# Patient Record
Sex: Female | Born: 1950 | Race: White | Hispanic: No | Marital: Married | Smoking: Never smoker
Health system: Southern US, Community
[De-identification: ages and names within clinical notes are randomized; demographics above are authoritative.]

## PROBLEM LIST (undated history)

## (undated) DIAGNOSIS — K219 Gastro-esophageal reflux disease without esophagitis: Secondary | ICD-10-CM

## (undated) DIAGNOSIS — E039 Hypothyroidism, unspecified: Secondary | ICD-10-CM

## (undated) DIAGNOSIS — E7800 Pure hypercholesterolemia, unspecified: Secondary | ICD-10-CM

## (undated) DIAGNOSIS — G4700 Insomnia, unspecified: Secondary | ICD-10-CM

## (undated) HISTORY — PX: CHOLECYSTECTOMY: SHX55

---

## 2008-06-28 ENCOUNTER — Ambulatory Visit: Payer: Self-pay | Admitting: Internal Medicine

## 2011-08-06 ENCOUNTER — Ambulatory Visit: Payer: Self-pay | Admitting: Internal Medicine

## 2012-12-23 ENCOUNTER — Ambulatory Visit: Payer: Self-pay

## 2014-04-18 ENCOUNTER — Ambulatory Visit: Payer: Self-pay | Admitting: Physician Assistant

## 2014-06-30 IMAGING — CR DG CHEST 2V
1 series · 2 of 2 positions shown · non-contrast
Comparison: none

REASON FOR EXAM: cough and fever
COMMENTS:

PROCEDURE:     MDR - MDR CHEST PA(OR AP) AND LATERAL  - December 23, 2012 [DATE]
RESULT:     Comparison: None.

[Series 1: pa · 0.17mm/px · 2 of 2 slices shown]
[im 1/2]
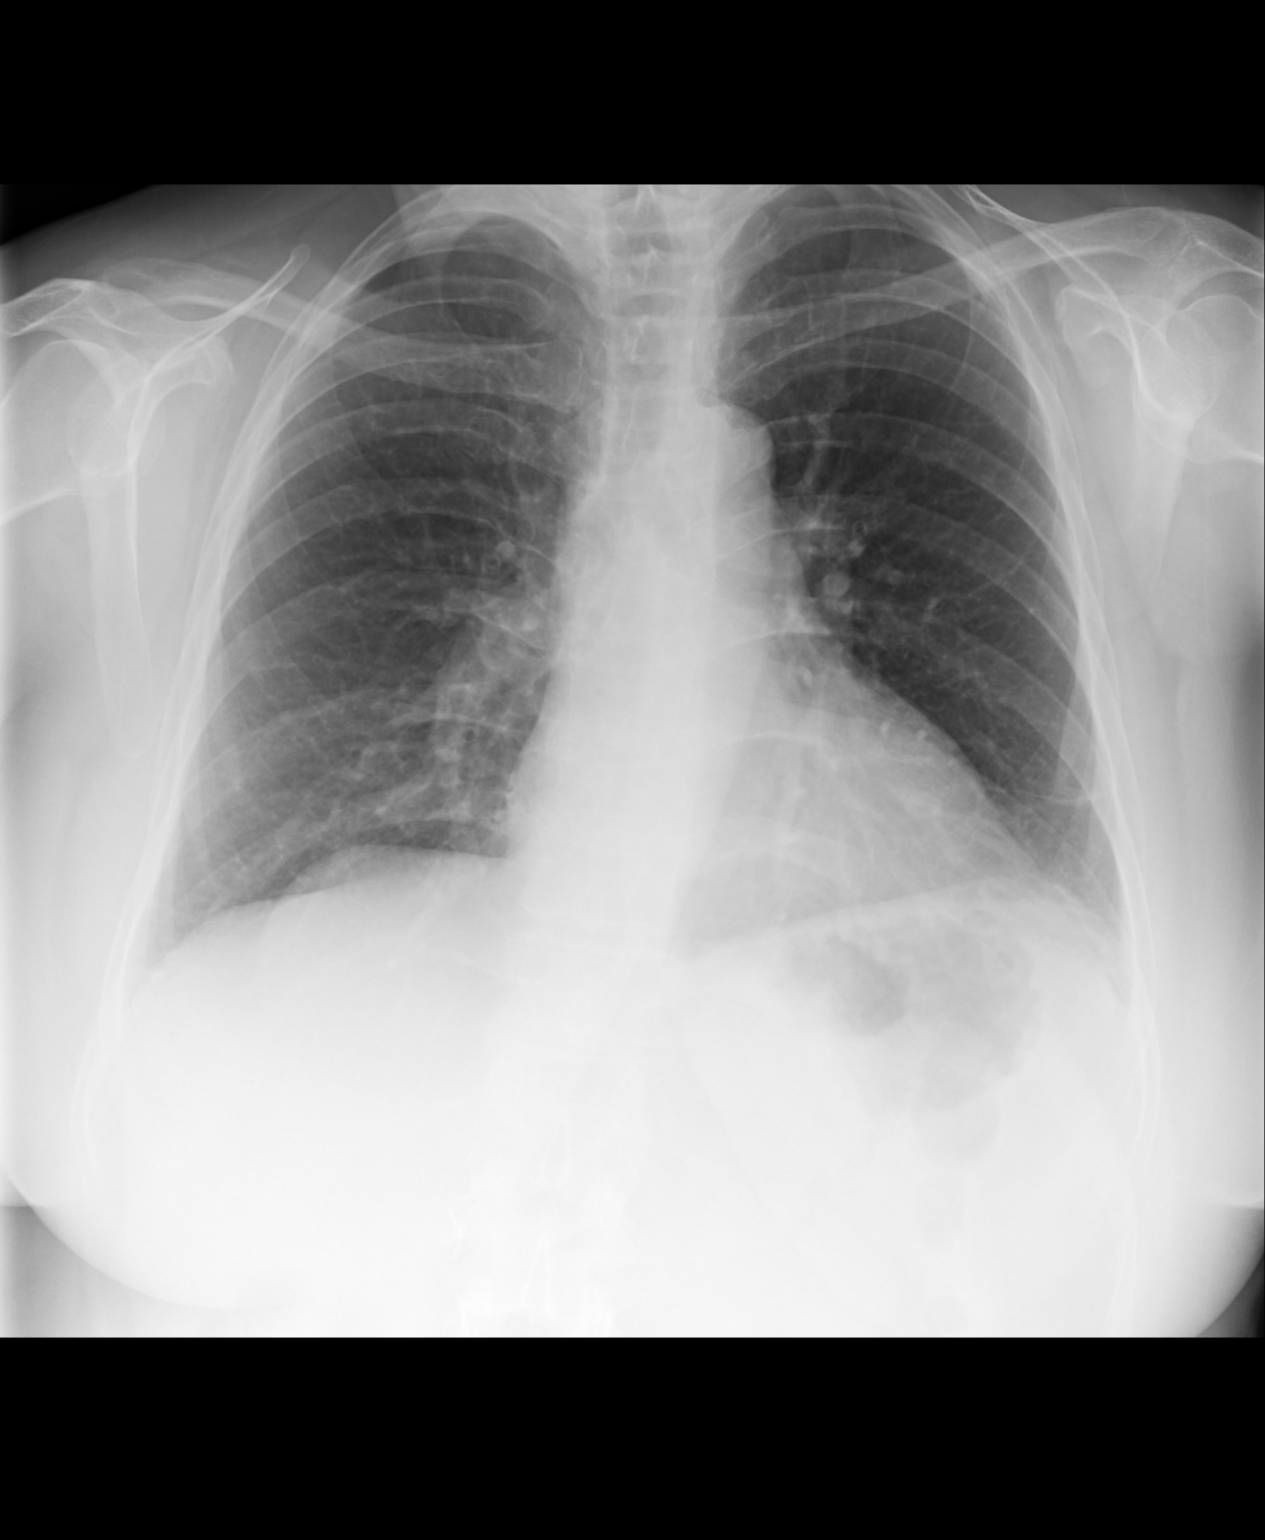
[im 2/2]
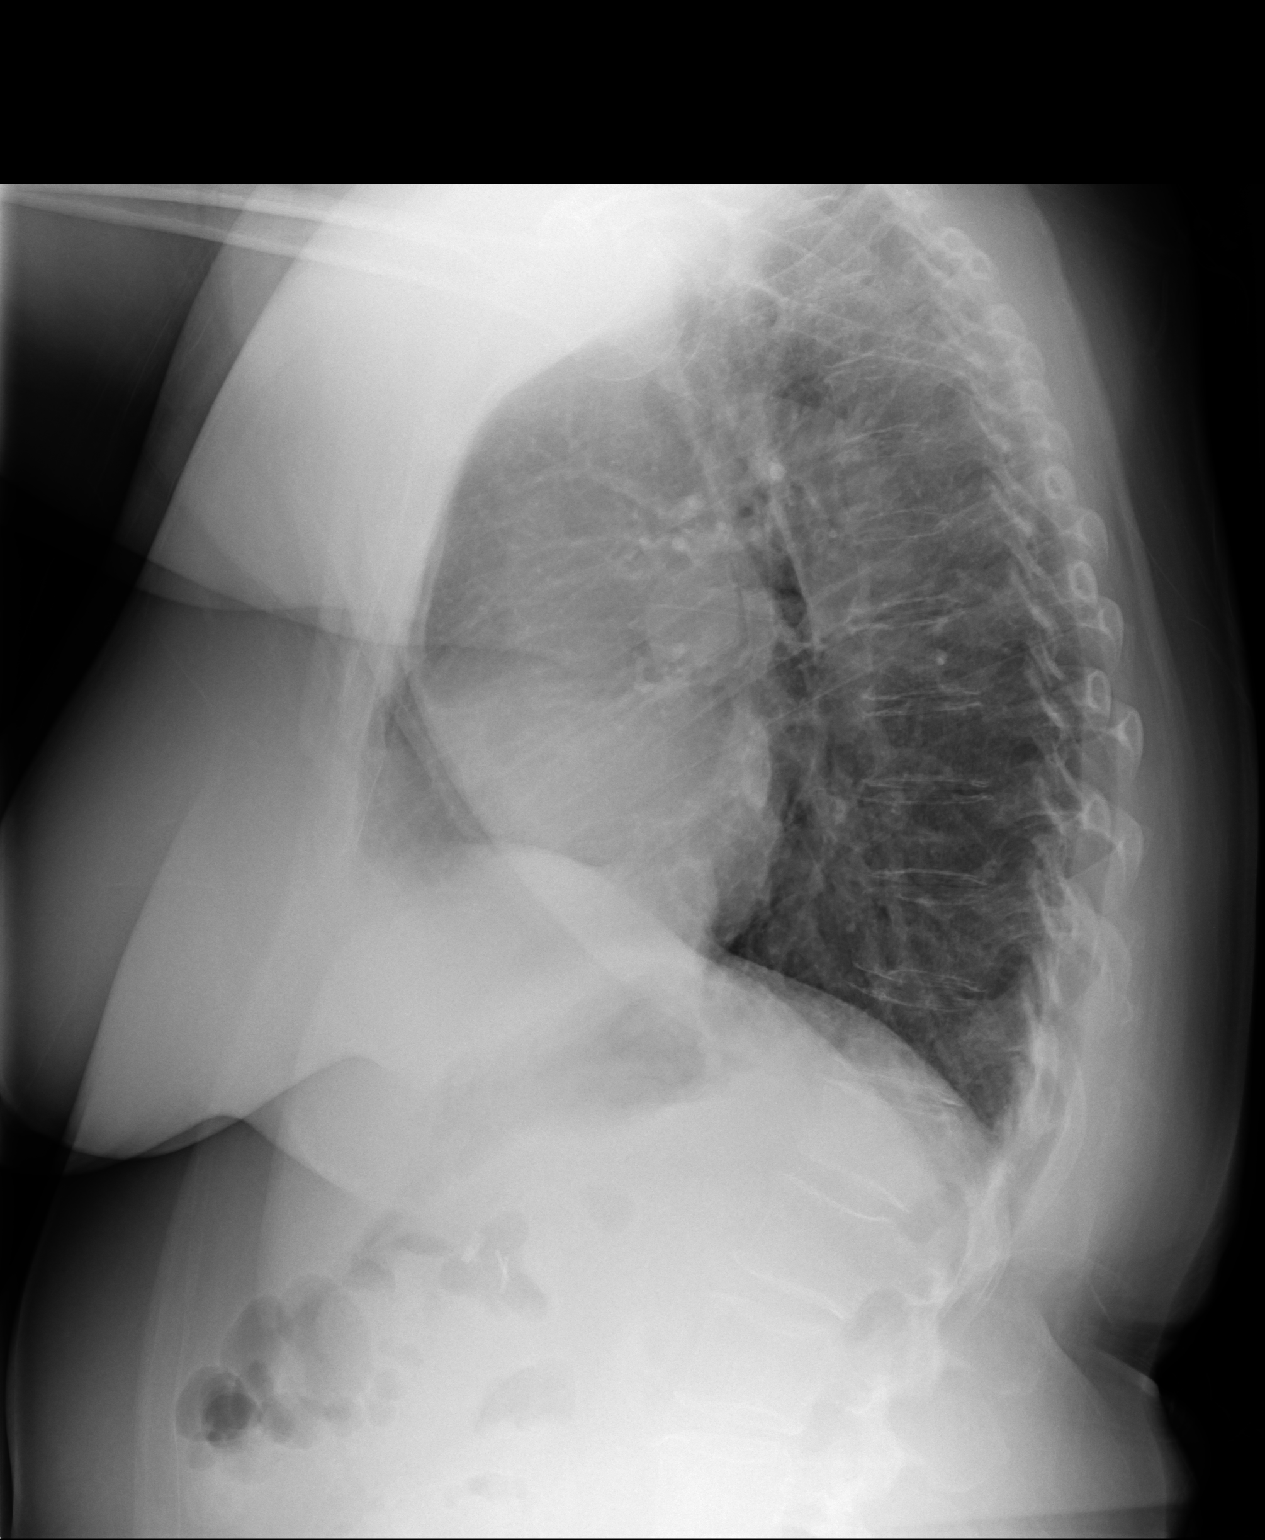

[2 of 2 positions shown; findings below may reference images not displayed]

FINDINGS: The heart and mediastinum are within normal limits. Minimal left basilar
reticular opacities are likely secondary to subsegmental atelectasis
scarring.
IMPRESSION: No acute cardiopulmonary disease.

[REDACTED]

## 2016-04-23 ENCOUNTER — Ambulatory Visit
Admission: EM | Admit: 2016-04-23 | Discharge: 2016-04-23 | Disposition: A | Discharge status: Home / Self Care | Payer: BC Managed Care – PPO

## 2016-04-23 DIAGNOSIS — J069 Acute upper respiratory infection, unspecified: Secondary | ICD-10-CM | POA: Diagnosis not present

## 2016-04-23 HISTORY — DX: Insomnia, unspecified: G47.00

## 2016-04-23 HISTORY — DX: Gastro-esophageal reflux disease without esophagitis: K21.9

## 2016-04-23 HISTORY — DX: Pure hypercholesterolemia, unspecified: E78.00

## 2016-04-23 HISTORY — DX: Hypothyroidism, unspecified: E03.9

## 2016-04-23 NOTE — Discharge Instructions (Signed)
Rest. Drink plenty of fluids.  ° °Follow up with your primary care physician this week as needed. Return to Urgent care for new or worsening concerns.  ° °

## 2016-04-23 NOTE — ED Provider Notes (Signed)
[facility] ____________________________________________  Time seen: Approximately 11:17 AM  I have reviewed the triage vital signs and the nursing notes.   HISTORY  Chief Complaint URI  HPI Donna Yu is a 65 y.o. female resents with a complaint of 2-3 days of runny nose, nasal drainage, postnasal drainage, scratchy throat, occasional achiness and occasional cough. Patient states that her symptoms are mild. Denies fevers. Patient reports that she was around her family this past weekend and reports that since then multiple other members of the family have been sick with similar. Patient states that the children ended up being worse off, and states that they were having fevers and worse symptoms.   Patient reports that she continues to eat and drink well and has remained active. Patient states that she came in today and requests possibly receiving an antiviral prescription for her symptoms.  Denies chest pain or shortness of breath, weakness, fevers, vision changes, activity changes, extremity pain, extremity swelling, dysuria, abdominal pain, fevers. Denies recent sickness. Denies recent hospitalization. Denies recent antibiotic use. Patient reports she has not taking any medications for same complaints.   PCP: Raul Del   Past Medical History:  Diagnosis Date  . GERD (gastroesophageal reflux disease)   . High cholesterol   . Hypothyroid   . Insomnia     There are no active problems to display for this patient.   Past Surgical History:  Procedure Laterality Date  . CHOLECYSTECTOMY      No current facility-administered medications for this encounter.   Current Outpatient Prescriptions:  .  clonazePAM (KLONOPIN) 0.5 MG tablet, Take 0.5 mg by mouth 2 (two) times daily as needed for anxiety., Disp: , Rfl:  .  lansoprazole (PREVACID) 15 MG capsule, Take 15 mg by mouth daily at 12 noon., Disp: , Rfl:  .  levothyroxine (SYNTHROID, LEVOTHROID) 88 MCG tablet, Take  88 mcg by mouth daily before breakfast., Disp: , Rfl:  .  Melatonin 10 MG TABS, Take by mouth., Disp: , Rfl:  .  ranitidine (ZANTAC) 75 MG tablet, Take 75 mg by mouth 2 (two) times daily., Disp: , Rfl:  .  rosuvastatin (CRESTOR) 5 MG tablet, Take 5 mg by mouth daily., Disp: , Rfl:   Allergies Penicillins  History reviewed. No pertinent family history.  Social History Social History  Substance Use Topics  . Smoking status: Never Smoker  . Smokeless tobacco: Never Used  . Alcohol use No    Review of Systems Constitutional: No fever/chills Eyes: No visual changes. ENT: No sore throat. Cardiovascular: Denies chest pain. Respiratory: Denies shortness of breath. Gastrointestinal: No abdominal pain.  No nausea, no vomiting.  No diarrhea.  No constipation. Genitourinary: Negative for dysuria. Musculoskeletal: Negative for back pain. Skin: Negative for rash. Neurological: Negative for headaches, focal weakness or numbness.  10-point ROS otherwise negative.  ____________________________________________   PHYSICAL EXAM:  VITAL SIGNS: ED Triage Vitals  Enc Vitals Group     BP 04/23/16 1055 (!) 114/55     Pulse Rate 04/23/16 1055 76     Resp 04/23/16 1055 18     Temp 04/23/16 1055 98.7 F (37.1 C)     Temp Source 04/23/16 1055 Oral     SpO2 04/23/16 1055 97 %     Weight --      Height --      Head Circumference --      Peak Flow --      Pain Score 04/23/16 1101 4     Pain  Loc --      Pain Edu? --      Excl. in GC? --    Constitutional: Alert and oriented. Well appearing and in no acute distress. Eyes: Conjunctivae are normal. PERRL. EOMI. Head: Atraumatic. No sinus tenderness to palpation. No swelling. No erythema.  Ears: no erythema, normal TMs bilaterally.   Nose:Nasal congestion with clear rhinorrhea  Mouth/Throat: Mucous membranes are moist. No pharyngeal erythema. No tonsillar swelling or exudate.  Neck: No stridor.  No cervical spine tenderness to  palpation. Hematological/Lymphatic/Immunilogical: No cervical lymphadenopathy. Cardiovascular: Normal rate, regular rhythm. Grossly normal heart sounds.  Good peripheral circulation. Respiratory: Normal respiratory effort.  No retractions. Lungs CTAB. No wheezes, rales or rhonchi. Good air movement.  Gastrointestinal: Soft and nontender. Normal Bowel sounds. No CVA tenderness. Musculoskeletal: No lower or upper extremity tenderness nor edema. No cervical, thoracic or lumbar tenderness to palpation. Neurologic:  Normal speech and language. No gross focal neurologic deficits are appreciated. No gait instability. Skin:  Skin is warm, dry and intact. No rash noted. Psychiatric: Mood and affect are normal. Speech and behavior are normal.  ___________________________________________   LABS (all labs ordered are listed, but only abnormal results are displayed)  Labs Reviewed - No data to display  PROCEDURES Procedures   INITIAL IMPRESSION / ASSESSMENT AND PLAN / ED COURSE  Pertinent labs & imaging results that were available during my care of the patient were reviewed by me and considered in my medical decision making (see chart for details).  Very well-appearing patient. No acute distress. Presents for complaints of 2-3 days of runny nose, nasal congestion and cough. Exam reassuring. Suspect viral upper respiratory infection. Counseled regarding use of antivirals. After discussing with patient, patient states that all that she does not want antiviral prescription. Patient declines need for prescription medication. Encouraged rest, fluids and PCP follow-up as needed.  Discussed follow up with Primary care physician this week. Discussed follow up and return parameters including no resolution or any worsening concerns. Patient verbalized understanding and agreed to plan.   ____________________________________________   FINAL CLINICAL IMPRESSION(S) / ED DIAGNOSES  Final diagnoses:  URI  (upper respiratory infection)     Discharge Medication List as of 04/23/2016 11:17 AM      Note: This dictation was prepared with Dragon dictation along with smaller phrase technology. Any transcriptional errors that result from this process are unintentional.    Clinical Course      Renford DillsLindsey Annistyn Depass, NP 04/23/16 1227

## 2016-04-23 NOTE — ED Triage Notes (Signed)
Pt reports multiple exposures to various URI viruses.  Has had drainage down back of throat with scratchy, achy, feels tired.  No fever, mild cough with drainage.

## 2016-10-30 ENCOUNTER — Ambulatory Visit
Admission: EM | Admit: 2016-10-30 | Discharge: 2016-10-30 | Disposition: A | Discharge status: Home / Self Care | Payer: Medicare Other | Attending: Family Medicine | Admitting: Family Medicine

## 2016-10-30 ENCOUNTER — Encounter: Payer: Self-pay | Admitting: Emergency Medicine

## 2016-10-30 DIAGNOSIS — R69 Illness, unspecified: Secondary | ICD-10-CM | POA: Diagnosis not present

## 2016-10-30 DIAGNOSIS — J111 Influenza due to unidentified influenza virus with other respiratory manifestations: Secondary | ICD-10-CM

## 2016-10-30 MED ORDER — HYDROCOD POLST-CPM POLST ER 10-8 MG/5ML PO SUER: 5.0000 mL | Freq: Every evening | ORAL | 0 refills | Status: AC | PRN

## 2016-10-30 MED ORDER — BENZONATATE 100 MG PO CAPS: 100.0000 mg | ORAL_CAPSULE | Freq: Three times a day (TID) | ORAL | 0 refills | Status: AC | PRN

## 2016-10-30 MED ORDER — OSELTAMIVIR PHOSPHATE 75 MG PO CAPS: 75.0000 mg | ORAL_CAPSULE | Freq: Two times a day (BID) | ORAL | 0 refills | Status: AC

## 2016-10-30 NOTE — ED Triage Notes (Signed)
Patient c/o cough, chest congestion fever that started yesterday. Patient reports fever last night.

## 2016-10-30 NOTE — ED Provider Notes (Signed)
[facility] ____________________________________________  Time seen: Approximately 1216 PM  I have reviewed the triage vital signs and the nursing notes.   HISTORY  Chief Complaint Fever and Cough   HPI Donna Yu is a 66 y.o. female presenting for the complaints of runny nose, nasal congestion, cough, chills and body aches that started yesterday. Reports accompanying fever. Reports symptoms were quick in onset. Reports felt fine yesterday morning. Reports has taken over-the-counter cough and congestion medications with some improvement, no resolution. States cough is a dry hacking cough, intermittently woke her up last night. Denies wheezing or shortness of breath. Denies known sick contact. Reports continues to eat and drink well. Denies sore throat.  Denies chest pain, shortness of breath, abdominal pain, dysuria, extremity pain, extremity swelling or rash. Denies recent sickness. Denies recent antibiotic use. Denies cardiac history. Denies renal insufficiency.   Kristopher Oppenheim, MD: PCP   Past Medical History:  Diagnosis Date  . GERD (gastroesophageal reflux disease)   . High cholesterol   . Hypothyroid   . Insomnia     There are no active problems to display for this patient.   Past Surgical History:  Procedure Laterality Date  . CHOLECYSTECTOMY       No current facility-administered medications for this encounter.   Current Outpatient Prescriptions:  .  levothyroxine (SYNTHROID, LEVOTHROID) 100 MCG tablet, Take 100 mcg by mouth daily before breakfast., Disp: , Rfl:  .  benzonatate (TESSALON PERLES) 100 MG capsule, Take 1 capsule (100 mg total) by mouth 3 (three) times daily as needed for cough., Disp: 15 capsule, Rfl: 0 .  chlorpheniramine-HYDROcodone (TUSSIONEX PENNKINETIC ER) 10-8 MG/5ML SUER, Take 5 mLs by mouth at bedtime as needed for cough. do not drive or operate machinery while taking as can cause drowsiness., Disp: 75 mL, Rfl:  0 .  clonazePAM (KLONOPIN) 0.5 MG tablet, Take 0.5 mg by mouth 2 (two) times daily as needed for anxiety., Disp: , Rfl:  .  lansoprazole (PREVACID) 15 MG capsule, Take 15 mg by mouth daily at 12 noon., Disp: , Rfl:  .  Melatonin 10 MG TABS, Take by mouth., Disp: , Rfl:  .  oseltamivir (TAMIFLU) 75 MG capsule, Take 1 capsule (75 mg total) by mouth every 12 (twelve) hours., Disp: 10 capsule, Rfl: 0 .  ranitidine (ZANTAC) 75 MG tablet, Take 75 mg by mouth 2 (two) times daily., Disp: , Rfl:  .  rosuvastatin (CRESTOR) 5 MG tablet, Take 5 mg by mouth daily., Disp: , Rfl:   Allergies Penicillins  History reviewed. No pertinent family history.  Social History Social History  Substance Use Topics  . Smoking status: Never Smoker  . Smokeless tobacco: Never Used  . Alcohol use No    Review of Systems Constitutional: As above. Eyes: No visual changes. ENT: As above. Cardiovascular: Denies chest pain. Respiratory: Denies shortness of breath. Gastrointestinal: No abdominal pain.  No nausea, no vomiting.  No diarrhea.  No constipation. Genitourinary: Negative for dysuria. Musculoskeletal: Negative for back pain. Skin: Negative for rash. Neurological: Negative for headaches, focal weakness or numbness.  10-point ROS otherwise negative.  ____________________________________________   PHYSICAL EXAM:  VITAL SIGNS: ED Triage Vitals  Enc Vitals Group     BP 10/30/16 1134 123/64     Pulse Rate 10/30/16 1134 94     Resp 10/30/16 1134 16     Temp 10/30/16 1134 98 F (36.7 C)     Temp Source 10/30/16 1134 Oral     SpO2  10/30/16 1134 96 %     Weight 10/30/16 1132 168 lb (76.2 kg)     Height 10/30/16 1132 5\' 2"  (1.575 m)     Head Circumference --      Peak Flow --      Pain Score 10/30/16 1134 3     Pain Loc --      Pain Edu? --      Excl. in GC? --    Constitutional: Alert and oriented. Well appearing and in no acute distress. Eyes: Conjunctivae are normal. PERRL. EOMI. Head:  Atraumatic. No sinus tenderness to palpation. No swelling. No erythema.  Ears: no erythema, normal TMs bilaterally.   Nose:Nasal congestion with clear rhinorrhea  Mouth/Throat: Mucous membranes are moist. No pharyngeal erythema. No tonsillar swelling or exudate.  Neck: No stridor.  No cervical spine tenderness to palpation. Hematological/Lymphatic/Immunilogical: No cervical lymphadenopathy. Cardiovascular: Normal rate, regular rhythm. Grossly normal heart sounds.  Good peripheral circulation. Respiratory: Normal respiratory effort.  No retractions. No wheezes, rales or rhonchi. Good air movement. Dry intermittent cough noted in room. Gastrointestinal: Soft and nontender. No CVA tenderness. Musculoskeletal: Ambulatory with steady gait. No cervical, thoracic or lumbar tenderness to palpation. Bilateral lower extremities nontender and no edema noted. Neurologic:  Normal speech and language. No gait instability. Skin:  Skin appears warm, dry and intact. No rash noted. Psychiatric: Mood and affect are normal. Speech and behavior are normal.  ___________________________________________   LABS (all labs ordered are listed, but only abnormal results are displayed)  Labs Reviewed - No data to display ____________________________________________  PROCEDURES Procedures   INITIAL IMPRESSION / ASSESSMENT AND PLAN / ED COURSE  Pertinent labs & imaging results that were available during my care of the patient were reviewed by me and considered in my medical decision making (see chart for details).  Well-appearing patient. No acute distress. Discussed with patient suspect influenza. Discussed evaluation and treatment options with patient. Will treat patient with oral Tamiflu, when necessary Tessalon Perles and when necessary Tussionex. Encourage rest, fluids and supportive care. Discussed strict follow-up and return parameters.Discussed indication, risks and benefits of medications with  patient.  Discussed follow up with Primary care physician this week. Discussed follow up and return parameters including no resolution or any worsening concerns. Patient verbalized understanding and agreed to plan.   ____________________________________________   FINAL CLINICAL IMPRESSION(S) / ED DIAGNOSES  Final diagnoses:  Influenza-like illness     Discharge Medication List as of 10/30/2016 12:28 PM    START taking these medications   Details  benzonatate (TESSALON PERLES) 100 MG capsule Take 1 capsule (100 mg total) by mouth 3 (three) times daily as needed for cough., Starting Sun 10/30/2016, Normal    chlorpheniramine-HYDROcodone (TUSSIONEX PENNKINETIC ER) 10-8 MG/5ML SUER Take 5 mLs by mouth at bedtime as needed for cough. do not drive or operate machinery while taking as can cause drowsiness., Starting Sun 10/30/2016, Print    oseltamivir (TAMIFLU) 75 MG capsule Take 1 capsule (75 mg total) by mouth every 12 (twelve) hours., Starting Sun 10/30/2016, Normal        Note: This dictation was prepared with Dragon dictation along with smaller phrase technology. Any transcriptional errors that result from this process are unintentional.         Renford DillsLindsey Cerys Winget, NP 10/30/16 1747

## 2016-10-30 NOTE — Discharge Instructions (Signed)
Take medication as prescribed. Rest. Drink plenty of fluids.  ° °Follow up with your primary care physician this week as needed. Return to Urgent care for new or worsening concerns.  ° °

## 2022-04-12 DEATH — deceased
